# Patient Record
Sex: Female | Born: 1975 | Race: Black or African American | Hispanic: No | Smoking: Never smoker
Health system: Southern US, Community
[De-identification: ages and names within clinical notes are randomized; demographics above are authoritative.]

## PROBLEM LIST (undated history)

## (undated) DIAGNOSIS — I10 Essential (primary) hypertension: Secondary | ICD-10-CM

---

## 2005-09-29 ENCOUNTER — Ambulatory Visit (HOSPITAL_COMMUNITY): Admission: RE | Admit: 2005-09-29 | Discharge: 2005-09-29 | Payer: Self-pay | Admitting: Obstetrics and Gynecology

## 2010-06-12 ENCOUNTER — Emergency Department (HOSPITAL_COMMUNITY): Admission: EM | Admit: 2010-06-12 | Discharge: 2010-06-13 | Payer: Self-pay | Admitting: Emergency Medicine

## 2010-09-05 ENCOUNTER — Encounter: Payer: Self-pay | Admitting: Family Medicine

## 2011-01-26 ENCOUNTER — Other Ambulatory Visit: Payer: Self-pay | Admitting: Obstetrics and Gynecology

## 2011-01-26 ENCOUNTER — Encounter (HOSPITAL_COMMUNITY): Payer: BC Managed Care – PPO

## 2011-01-26 LAB — CBC
HCT: 37.2 % (ref 36.0–46.0)
MCH: 28.9 pg (ref 26.0–34.0)
WBC: 5 10*3/uL (ref 4.0–10.5)

## 2011-01-26 LAB — SURGICAL PCR SCREEN: Staphylococcus aureus: NEGATIVE

## 2011-02-02 ENCOUNTER — Other Ambulatory Visit: Payer: Self-pay | Admitting: Obstetrics and Gynecology

## 2011-02-02 ENCOUNTER — Ambulatory Visit (HOSPITAL_COMMUNITY)
Admission: EM | Admit: 2011-02-02 | Discharge: 2011-02-03 | Disposition: A | Discharge status: Home / Self Care | Payer: BC Managed Care – PPO | Source: Ambulatory Visit | Attending: Obstetrics and Gynecology | Admitting: Obstetrics and Gynecology

## 2011-02-02 ENCOUNTER — Ambulatory Visit (HOSPITAL_COMMUNITY): Admission: AD | Admit: 2011-02-02 | Payer: Self-pay | Source: Ambulatory Visit | Admitting: Obstetrics and Gynecology

## 2011-02-02 DIAGNOSIS — B373 Candidiasis of vulva and vagina: Secondary | ICD-10-CM | POA: Insufficient documentation

## 2011-02-02 DIAGNOSIS — N938 Other specified abnormal uterine and vaginal bleeding: Secondary | ICD-10-CM | POA: Insufficient documentation

## 2011-02-02 DIAGNOSIS — Z01818 Encounter for other preprocedural examination: Secondary | ICD-10-CM | POA: Insufficient documentation

## 2011-02-02 DIAGNOSIS — N949 Unspecified condition associated with female genital organs and menstrual cycle: Secondary | ICD-10-CM | POA: Insufficient documentation

## 2011-02-02 DIAGNOSIS — B3731 Acute candidiasis of vulva and vagina: Secondary | ICD-10-CM | POA: Insufficient documentation

## 2011-02-02 DIAGNOSIS — Z01812 Encounter for preprocedural laboratory examination: Secondary | ICD-10-CM | POA: Insufficient documentation

## 2011-02-03 LAB — CBC
HCT: 36.8 % (ref 36.0–46.0)
MCV: 86.2 fL (ref 78.0–100.0)
Platelets: 345 10*3/uL (ref 150–400)
WBC: 10.9 10*3/uL — ABNORMAL HIGH (ref 4.0–10.5)

## 2011-02-09 ENCOUNTER — Inpatient Hospital Stay (HOSPITAL_COMMUNITY)
Admission: AD | Admit: 2011-02-09 | Discharge: 2011-02-10 | Disposition: A | Discharge status: Home / Self Care | Payer: BC Managed Care – PPO | Source: Ambulatory Visit | Attending: Obstetrics and Gynecology | Admitting: Obstetrics and Gynecology

## 2011-02-09 ENCOUNTER — Inpatient Hospital Stay (HOSPITAL_COMMUNITY): Payer: BC Managed Care – PPO

## 2011-02-09 DIAGNOSIS — R109 Unspecified abdominal pain: Secondary | ICD-10-CM | POA: Insufficient documentation

## 2011-02-09 DIAGNOSIS — G8918 Other acute postprocedural pain: Secondary | ICD-10-CM | POA: Insufficient documentation

## 2011-02-09 LAB — DIFFERENTIAL
Eosinophils Absolute: 0.1 10*3/uL (ref 0.0–0.7)
Eosinophils Relative: 1 % (ref 0–5)
Lymphocytes Relative: 15 % (ref 12–46)
Monocytes Absolute: 1.1 10*3/uL — ABNORMAL HIGH (ref 0.1–1.0)
Monocytes Relative: 10 % (ref 3–12)

## 2011-02-09 LAB — COMPREHENSIVE METABOLIC PANEL
ALT: 7 U/L (ref 0–35)
AST: 14 U/L (ref 0–37)
Albumin: 3.1 g/dL — ABNORMAL LOW (ref 3.5–5.2)
CO2: 26 mEq/L (ref 19–32)
Calcium: 9 mg/dL (ref 8.4–10.5)
Creatinine, Ser: 0.71 mg/dL (ref 0.50–1.10)
GFR calc Af Amer: >60 mL/min (ref >60)
Glucose, Bld: 96 mg/dL (ref 70–99)
Total Bilirubin: 0.3 mg/dL (ref 0.3–1.2)
Total Protein: 6.9 g/dL (ref 6.0–8.3)

## 2011-02-09 LAB — CBC: RBC: 4.06 MIL/uL (ref 3.87–5.11)

## 2011-02-10 ENCOUNTER — Encounter (HOSPITAL_COMMUNITY): Payer: Self-pay

## 2011-02-10 LAB — URINALYSIS, ROUTINE W REFLEX MICROSCOPIC
Glucose, UA: NEGATIVE mg/dL
Nitrite: NEGATIVE
Protein, ur: NEGATIVE mg/dL

## 2011-02-10 MED ORDER — IOHEXOL 300 MG/ML  SOLN
100.0000 mL | Freq: Once | INTRAMUSCULAR | Status: AC | PRN
  Administered 2011-02-10: 100 mL via INTRAVENOUS

## 2011-02-11 LAB — URINE CULTURE
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
## 2011-03-04 NOTE — Op Note (Signed)
[name], [name]               ACCOUNT NO.:  [id number]  MEDICAL RECORD NO.:  [id number]  LOCATION:  9316                          FACILITY:  [facility]  PHYSICIAN:  Osborn Coho, M.D.   DATE OF BIRTH:  1975-10-24  DATE OF PROCEDURE:  02/02/2011 DATE OF DISCHARGE:                              OPERATIVE REPORT   PREOPERATIVE DIAGNOSES: 1. Dysfunctional uterine bleeding. 2. Menorrhagia. 3. Status post ablation.  POSTOPERATIVE DIAGNOSES: 1. Dysfunctional uterine bleeding. 2. Menorrhagia. 3. Status post ablation.  PROCEDURES: 1. Total laparoscopic hysterectomy. 2. Cystoscopy.  ATTENDING SURGEON:  Osborn Coho, MD  ASSISTANT:  Naima A. Dillard, MD  ANESTHESIA:  General.  SPECIMENS TO PATHOLOGY:  Uterus and cervix weighing 131 grams.  FLUIDS:  1000 mL.  URINE OUTPUT:  300 mL.  ESTIMATED BLOOD LOSS:  100 mL.  COMPLICATIONS:  None.  DESCRIPTION OF PROCEDURE:  The patient was taken to the operating room after risks, benefits, and alternatives discussed with the patient.  The patient verbalized understanding, consent signed and witness.  The patient was placed under general anesthesia and prepped and draped in the normal sterile fashion in the dorsal lithotomy position.  A weighted speculum placed in the patient's vagina and vaginal wall retractors placed to visualize the cervix.  The cervix was grasped with single- tooth tenaculum and sounded to approximately 7 cm.  A size 6 probe for the Rumi was used and placed into the uterus without difficulty.  The intrauterine balloon was unable to be insufflated secondary to the history of the ablation and so the [facility] ring was sutured to the cervix. The occluder was then insufflated with about approximately 60 mL of saline.  The Foley was placed in the bladder to gravity.  After re- gloving and gowning, attention was then turned to the abdomen where a 10- mm umbilical incision was made in a vertical fashion to the level of  the patient's prior umbilical hernia repair scar.  Open laparoscopy was performed and the incision was taken down to the layer of the fascia which was then incised and the peritoneum entered.  The Hasson was placed after placing a pursestring stitch of 0 Vicryl in the fascia. The camera was placed as well and normal-appearing bilateral ovaries and fallopian tubes were old noted with what appeared to be a normal- appearing corpus luteal cyst on the patient's right ovary.  Dilute Marcaine was injected at all incision sites and attention was then turned to the right lower quadrant where a 5-mm incision was made and 5- mm trocar advanced under direct visualization.  Two fingerbreadths rostral, and medial to this incision, a 10-mm incision was made and 10- mm trocar advanced to the intra-abdominal cavity under direct visualization.  In the left lower quadrant, a 5-mm incision was made and 5-mm trocar advanced under direct visualization.  Using the Harmonic, the right utero-ovarian, fallopian tube, and round ligament was excised and bladder flap created on that side.  The same was done on the contralateral side.  Anterior colpotomy was performed at the level of the Koh ring and the right uterine vessel was cauterized and excised, and the Koh ring circumscribed to around to the right side of  the uterus.  The same was done on the contralateral side.  The remainder of the [facility] ring was then circumscribed with the harmonic and the uterus was removed and placed in the vagina.  The vaginal cuff was repaired with 1 PDS via several stitches that were sutured laparoscopically.  The bilateral ovarian pedicles were noted to be hemostatic and the intra- abdominal cavity was copiously irrigated.  The cuff was hemostatic as well.  The fascial closure device was used at the right lower quadrant 10-mm port using 0 Vicryl and the incision was repaired with 3-0 Monocryl via subcuticular stitch.  All the trocars  were removed under direct visualization.  The pursestring stitch was tied at the umbilicus and the umbilical incision was repaired with 3-0 Monocryl via subcuticular stitch.  All incisions were prepped with Dermabond. Attention was then turned to the perineum where the Foley was removed and indigo carmine administered.  A bivalve speculum was placed in the patient's vagina and the vaginal cuff had good reapproximation. Cystoscopy was then performed and bilateral ureters were noted to be efflux without difficulty.  There were also no inadvertent bladder injuries.  All instruments were removed and the Foley was again placed in the bladder to gravity.  The patient tolerated the procedure well and was awaiting extubation and returned to recovery room in good condition.     Osborn Coho, M.D.     AR/MEDQ  D:  02/02/2011  T:  02/03/2011  Job:  [id number]  Electronically Signed by Osborn Coho M.D. on 03/04/2011 05:32:37 PM

## 2011-09-13 ENCOUNTER — Other Ambulatory Visit: Payer: Self-pay | Admitting: Family Medicine

## 2011-09-13 ENCOUNTER — Ambulatory Visit
Admission: RE | Admit: 2011-09-13 | Discharge: 2011-09-13 | Disposition: A | Discharge status: Home / Self Care | Payer: BC Managed Care – PPO | Source: Ambulatory Visit | Attending: Family Medicine | Admitting: Family Medicine

## 2011-09-13 DIAGNOSIS — R1909 Other intra-abdominal and pelvic swelling, mass and lump: Secondary | ICD-10-CM

## 2011-09-28 ENCOUNTER — Emergency Department (HOSPITAL_COMMUNITY): Payer: BC Managed Care – PPO

## 2011-09-28 ENCOUNTER — Encounter (HOSPITAL_COMMUNITY): Payer: Self-pay | Admitting: *Deleted

## 2011-09-28 ENCOUNTER — Emergency Department (HOSPITAL_COMMUNITY)
Admission: EM | Admit: 2011-09-28 | Discharge: 2011-09-28 | Disposition: A | Discharge status: Home / Self Care | Payer: BC Managed Care – PPO | Attending: Emergency Medicine | Admitting: Emergency Medicine

## 2011-09-28 ENCOUNTER — Other Ambulatory Visit: Payer: Self-pay

## 2011-09-28 DIAGNOSIS — R42 Dizziness and giddiness: Secondary | ICD-10-CM | POA: Insufficient documentation

## 2011-09-28 DIAGNOSIS — R5381 Other malaise: Secondary | ICD-10-CM | POA: Insufficient documentation

## 2011-09-28 DIAGNOSIS — Z79899 Other long term (current) drug therapy: Secondary | ICD-10-CM | POA: Insufficient documentation

## 2011-09-28 DIAGNOSIS — R030 Elevated blood-pressure reading, without diagnosis of hypertension: Secondary | ICD-10-CM | POA: Insufficient documentation

## 2011-09-28 DIAGNOSIS — I10 Essential (primary) hypertension: Secondary | ICD-10-CM

## 2011-09-28 DIAGNOSIS — R002 Palpitations: Secondary | ICD-10-CM

## 2011-09-28 LAB — URINALYSIS, ROUTINE W REFLEX MICROSCOPIC
Bilirubin Urine: NEGATIVE
Glucose, UA: NEGATIVE mg/dL
Hgb urine dipstick: NEGATIVE
Ketones, ur: NEGATIVE mg/dL
Protein, ur: NEGATIVE mg/dL

## 2011-09-28 LAB — BASIC METABOLIC PANEL
BUN: 9 mg/dL (ref 6–23)
CO2: 24 mEq/L (ref 19–32)
Calcium: 8.8 mg/dL (ref 8.4–10.5)
Creatinine, Ser: 0.71 mg/dL (ref 0.50–1.10)
Glucose, Bld: 80 mg/dL (ref 70–99)

## 2011-09-28 LAB — DIFFERENTIAL
Eosinophils Relative: 2 % (ref 0–5)
Lymphocytes Relative: 60 % — ABNORMAL HIGH (ref 12–46)
Lymphs Abs: 2.8 10*3/uL (ref 0.7–4.0)
Monocytes Absolute: 0.4 10*3/uL (ref 0.1–1.0)

## 2011-09-28 LAB — CBC
HCT: 38.3 % (ref 36.0–46.0)
MCH: 28.2 pg (ref 26.0–34.0)
MCV: 83.6 fL (ref 78.0–100.0)
RBC: 4.58 MIL/uL (ref 3.87–5.11)
WBC: 4.7 10*3/uL (ref 4.0–10.5)

## 2011-09-28 LAB — PREGNANCY, URINE: Preg Test, Ur: NEGATIVE

## 2011-09-28 LAB — TROPONIN I: Troponin I: <0.3 ng/mL (ref <0.30)

## 2011-09-28 MED ORDER — SODIUM CHLORIDE 0.9 % IV BOLUS (SEPSIS)
1000.0000 mL | Freq: Once | INTRAVENOUS | Status: AC
  Administered 2011-09-28: 1000 mL via INTRAVENOUS

## 2011-09-28 NOTE — Discharge Instructions (Signed)
Please read over the instructions below. Your EKG, cardiac enzymes, urine test and remaining labwork tonight were all normal. Your exam was normal as well. The exact cause of your symptoms tonight remains unclear. Your blood pressure was persistently elevated tonight with a high low range of 156/111 to 146/97. This will need to be re-evaluated by your primary care physician to determine if this is a trend for you that will require treatment for hypertension. Please cal Dr Clarene Duke today to arrange a follow up visit for further evaluation of these symptoms and your blood pressure. Return if symptoms worsen.  Dizziness  Dizziness is a common problem. It is a feeling of unsteadiness or lightheadedness. You may feel like you are about to faint. Dizziness can lead to injury if you stumble or fall. A person of any age group can suffer from dizziness, but dizziness is more common in older adults. CAUSES  Dizziness can be caused by many different things, including:  Middle ear problems.   Standing for too long.   Infections.   An allergic reaction.   Aging.   An emotional response to something, such as the sight of blood.   Side effects of medicines.   Fatigue.   Problems with circulation or blood pressure.   Excess use of alcohol, medicines, or illegal drug use.   Breathing too fast (hyperventilation).   An arrhythmia or problems with your heart rhythm.   Anemia or a low blood count.   Pregnancy.   Vomiting, diarrhea, fever, or other illnesses that cause dehydration.   Diseases or conditions such as Parkinson's disease, high blood pressure (hypertension), diabetes, and thyroid problems.   Exposure to extreme heat.  DIAGNOSIS  To find the cause of your dizziness, your caregiver may do a physical exam, lab tests, radiologic imaging scans, or an electrocardiography test (ECG).  TREATMENT  Treatment of dizziness depends on the cause of your symptoms and can vary greatly. HOME CARE  INSTRUCTIONS   Drink enough fluids to keep your urine clear or pale yellow. This is especially important in very hot weather. In the elderly, it is also important in cold weather.   If your dizziness is caused by medicines, take them exactly as directed. When taking blood pressure medicines, it is especially important to get up slowly.   Rise slowly from chairs and steady yourself until you feel okay.   In the morning, first sit up on the side of the bed. When this seems okay, stand slowly while holding onto something until you know your balance is fine.   If you need to stand in one place for a long time, be sure to move your legs often. Tighten and relax the muscles in your legs while standing.   If dizziness continues to be a problem, have someone stay with you for a day or two. Do this until you feel you are well enough to stay alone. Have the person call your caregiver if he or she notices changes in you that are concerning.   Do not drive or use heavy machinery if you feel dizzy.  SEEK IMMEDIATE MEDICAL CARE IF:   Your dizziness or lightheadedness gets worse.   You feel nauseous or vomit.   You develop problems with talking, walking, weakness, or using your arms, hands, or legs.   You are not thinking clearly or you have difficulty forming sentences. It may take a friend or family member to determine if your thinking is normal.   You develop chest  pain, abdominal pain, shortness of breath, or sweating.   Your vision changes.   You notice any bleeding.   You have side effects from medicine that seems to be getting worse rather than better.  MAKE SURE YOU:   Understand these instructions.   Will watch your condition.   Will get help right away if you are not doing well or get worse.  Document Released: 01/25/2001 Document Revised: 04/05/2011 Document Reviewed: 02/18/2011 [facility] Patient Information 2012 [place], [place].  Near-Syncope  Near-syncope is sudden weakness,  dizziness, or feeling like you might pass out (faint). This can happen when getting up or while standing for a long time. It can be caused by a drop in blood pressure. It is common in people taking medicine for blood pressure. Fainting can happen when the blood pressure or pulse is too low. HOME CARE  If you feel like you are going to pass out:   Lie down right away.   Breathe deeply and steadily.   Move only when the feeling has gone away. Most of the time, this feeling lasts only a few minutes. You may feel tired for several hours.   Drink enough fluids to keep your pee (urine) clear or pale yellow.   If you are taking blood pressure or heart medicine, stand up slowly.  GET HELP RIGHT AWAY IF:   You have a severe headache.   Unusual pain develops in the chest, belly (abdomen), or back.   You have bleeding from the mouth or butt (rectum), or you have black or tarry poop (stool).   You feel your heart beat differently than normal, or you have a very fast pulse.   You pass out, or you twitch and shake when you pass out.   You pass out when sitting or lying down.   You feel confused.   You have trouble walking.   You are weak.   You have vision problems.  MAKE SURE YOU:   Understand these instructions.   Will watch your condition.   Will get help right away if you are not doing well or get worse.  Document Released: 01/18/2008 Document Revised: 04/13/2011 Document Reviewed: 09/17/2010 [facility] Patient Information 2012 [place], [place].  Palpitations   A palpitation is the feeling that your heartbeat is irregular or is faster than normal. Although this is frightening, it usually is not serious. Palpitations may be caused by excesses of smoking, caffeine, or alcohol. They are also brought on by stress and anxiety. Sometimes, they are caused by heart disease. Unless otherwise noted, your caregiver did not find any signs of serious illness at this time. HOME CARE INSTRUCTIONS   To help prevent palpitations:  Drink decaffeinated coffee, tea, and soda pop. Avoid chocolate.   If you smoke or drink alcohol, quit or cut down as much as possible.   Reduce your stress or anxiety level. Biofeedback, yoga, or meditation will help you relax. Physical activity such as swimming, jogging, or walking also may be helpful.  SEEK MEDICAL CARE IF:   You continue to have a fast heartbeat.   Your palpitations occur more often.  SEEK IMMEDIATE MEDICAL CARE IF: You develop chest pain, shortness of breath, severe headache, dizziness, or fainting. Document Released: 07/29/2000 Document Revised: 04/13/2011 Document Reviewed: 09/28/2007 [facility] Patient Information 2012 [place], [place].Vertigo Vertigo means you feel like you are moving when you are not. Vertigo can make you feel like things around you are moving when they are not. This problem often goes away  on its own.  HOME CARE   Follow your doctor's instructions.   Avoid driving.   Avoid using heavy machinery.   Avoid doing any activity that could be dangerous if you have a vertigo attack.   Tell your doctor if a medicine seems to cause your vertigo.  GET HELP RIGHT AWAY IF:   Your medicines do not help or make you feel worse.   You have trouble talking or walking.   You feel weak or have trouble using your arms, hands, or legs.   You have bad headaches.   You keep feeling sick to your stomach (nauseous) or throwing up (vomiting).   Your vision changes.   A family member notices changes in your behavior.   Your problems get worse.  MAKE SURE YOU:  Understand these instructions.   Will watch your condition.   Will get help right away if you are not doing well or get worse.  Document Released: 05/10/2008 Document Revised: 04/13/2011 Document Reviewed: 02/17/2011 [facility] Patient Information 2012 [place], [place].

## 2011-09-28 NOTE — ED Notes (Signed)
Pt in stating she woke up feeling dizzy, when she stood up the dizziness increased and she felt like she was going to pass out and that her heart was racing, pt states she just doesn't feel right

## 2011-09-28 NOTE — ED Provider Notes (Signed)
History     CSN: [id number]  Arrival date & time 09/28/11  4098   First MD Initiated Contact with Patient 09/28/11 0424      Chief Complaint  Patient presents with  . Dizziness     Patient is a 36 y.o. female presenting with weakness. The history is provided by the patient.  Weakness The primary symptoms include dizziness. The symptoms are improving. The neurological symptoms are diffuse.  Dizziness also occurs with weakness.  Additional symptoms include weakness.  Pt states she awoke tonighjt prior to arrival and stated when she opened her eyes she felt as if the room was spinning. Sx's were similar to one's she has had associated w/ vertigo (for which she has been treated for in past) but worse. The room spinning lasted only 1-2 minutes. She stood briefly but felt "lightheaded" and sat back down. When she stood again she felt her heart racing and briefly felt as if she would faint but did not. Symptoms seemed to improve but did not resolve. She then showered and when the lightheadedness persisted she came to ED. Pt denies that at any time during this episode did she have CP, SOB, diaphoresis, n/v, visual disturbances, paresesias  or other unilateral symptoms.  Symptoms have nearly resolved but pt states she continues to feel "spacey".  History reviewed. No pertinent past medical history.  History reviewed. No pertinent past surgical history.  History reviewed. No pertinent family history.  History  Substance Use Topics  . Smoking status: Not on file  . Smokeless tobacco: Not on file  . Alcohol Use: Not on file    OB History    Grav Para Term Preterm Abortions TAB SAB Ect Mult Living                  Review of Systems  HENT: Negative.   Eyes: Negative.   Respiratory: Negative.   Cardiovascular: Positive for palpitations.  Gastrointestinal: Negative.   Genitourinary: Negative.   Musculoskeletal: Negative.   Skin: Negative.   Neurological: Positive for dizziness and  weakness.  Hematological: Negative.   Psychiatric/Behavioral: Negative.     Allergies  Diflucan and Flagyl  Home Medications   Current Outpatient Rx  Name Route Sig Dispense Refill  . FLUOXETINE HCL 10 MG PO CAPS Oral Take 10 mg by mouth daily.    Marland Kitchen VITAMIN D (ERGOCALCIFEROL) 50000 UNITS PO CAPS Oral Take 50,000 Units by mouth every 7 (seven) days.      BP 142/105  Pulse 70  Temp(Src) 98 F (36.7 C) (Oral)  Resp 20  SpO2 100%  Physical Exam  Constitutional: She is oriented to person, place, and time. She appears well-developed and well-nourished.  HENT:  Head: Normocephalic and atraumatic.  Right Ear: Tympanic membrane, external ear and ear canal normal.  Left Ear: Tympanic membrane, external ear and ear canal normal.  Nose: Nose normal.  Mouth/Throat: Uvula is midline, oropharynx is clear and moist and mucous membranes are normal.  Eyes: Conjunctivae and EOM are normal. Pupils are equal, round, and reactive to light.  Neck: Neck supple.  Cardiovascular: Normal rate and regular rhythm.        Hypertensive  Pulmonary/Chest: Effort normal and breath sounds normal.  Abdominal: Soft. Bowel sounds are normal.  Musculoskeletal: Normal range of motion.  Neurological: She is alert and oriented to person, place, and time. She has normal strength and normal reflexes. No cranial nerve deficit or sensory deficit. She displays a negative Romberg sign. Coordination normal. GCS  eye subscore is 4. GCS verbal subscore is 5. GCS motor subscore is 6.  Skin: Skin is warm and dry.  Psychiatric: She has a normal mood and affect.    ED Course  Procedures   Date: 09/28/2011  Rate: 65  Rhythm: Sinus rhythm  QRS Axis: normal  Intervals: normal  ST/T Wave abnormalities: normal  Conduction Disutrbances: Incomplete right bundle branch block  Narrative Interpretation:   Old EKG Reviewed: none available Findings and clinical impression discussed w/ pt. Will plan for d/c home w/ recommendation  to arrange close f/u w/ PCP for further evaluation of symptoms and HTN. Pt is agreeable w/ plan. Discussed pt w/ Dr Manus Gunning who has also seen and examined pt and is in agreement w/ plan.  Labs Reviewed  DIFFERENTIAL - Abnormal; Notable for the following:    Neutrophils Relative 29 (*)    Neutro Abs 1.4 (*)    Lymphocytes Relative 60 (*)    All other components within normal limits  BASIC METABOLIC PANEL - Abnormal; Notable for the following:    Sodium 133 (*)    All other components within normal limits  CBC  URINALYSIS, ROUTINE W REFLEX MICROSCOPIC  PREGNANCY, URINE  TROPONIN I   No results found.   No diagnosis found.    MDM  HPI/PE and clinical findings c/w 1.Dizziness (No focal neurological findings, EKG normal, Trop I neg, CXR normal) 2. Palpitations (EKG normal) Acute cardiac event considered but unlikely 3. HTN (Persistent, new onset and possible source of symptoms)    Leanne Chang, NP 09/30/11 [phone]

## 2011-09-30 NOTE — ED Provider Notes (Signed)
Medical screening examination/treatment/procedure(s) were conducted as a shared visit with non-physician practitioner(s) and myself.  I personally evaluated the patient during the encounter  Lightheadedness and palpitations.  No chest pain, SOB, nausea, vomiting.  EKG nonischemic, no Brugada.  Nonfocal neuro exam, no nystagmus.  Some catch up saccades on head impulse testing consistent with vestibular nerve dysfunction.  Glynn Octave, MD 09/30/11 [phone]

## 2013-10-02 IMAGING — CT CT PELVIS W/O CM
2 of 3 series · 14 of 36 positions shown, 19 images · IV contrast (OMNI 300, WATER)
Comparison: 02/10/2011.

CLINICAL DATA: Left inguinal bulge.  Inguinal hernia versus
adenopathy.  Inguinal mass.

CT PELVIS WITHOUT CONTRAST
TECHNIQUE: Multidetector CT imaging of the pelvis was performed
following the standard protocol without intravenous contrast.

[Series 601: coronal body · coronal · 0.78mm/px · 1 of 112 slices shown, 2 images]
[im 38/112  soft-tissue]
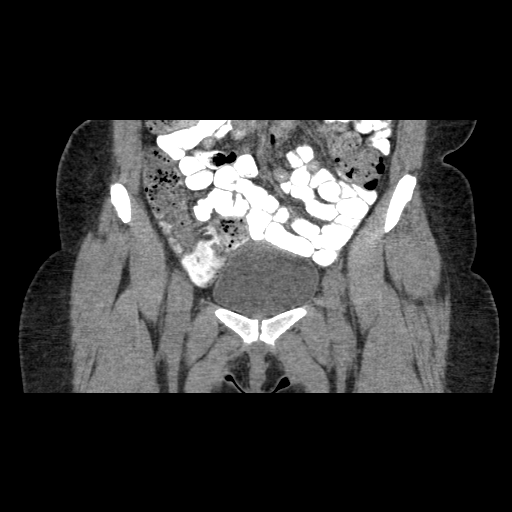
[im 38/112  bone]
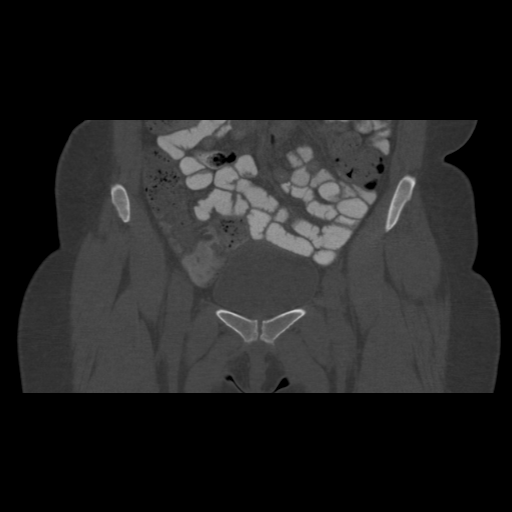

[Series 602: sagittal body · sagittal · 0.78mm/px · 13 of 161 slices shown, 17 images]
[im 7/161  lung]
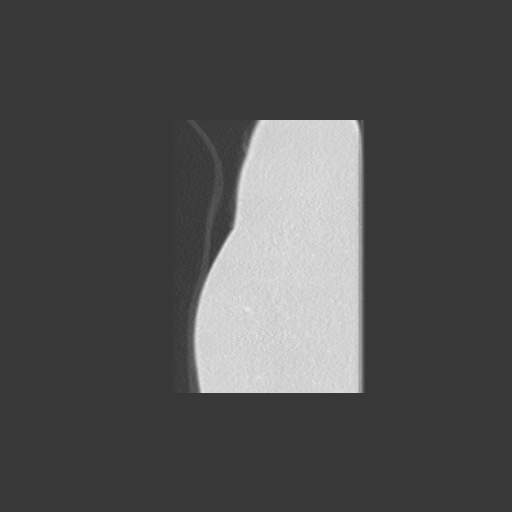
[im 13/161  soft-tissue]
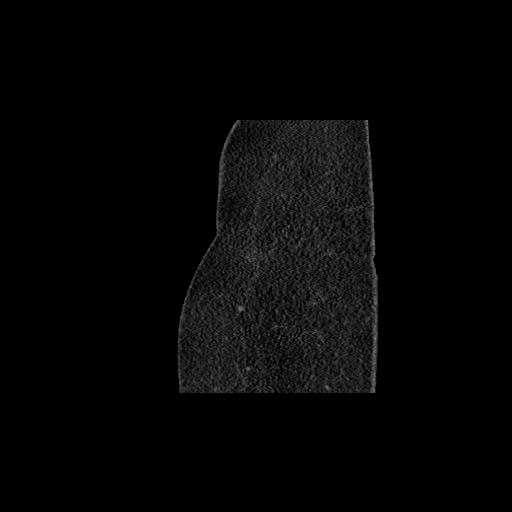
[im 13/161  lung]
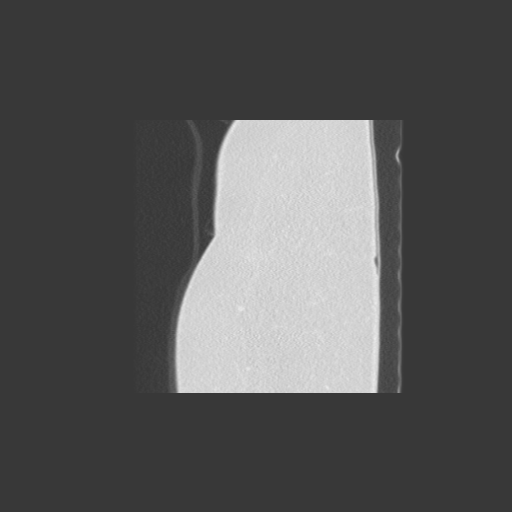
[im 13/161  bone]
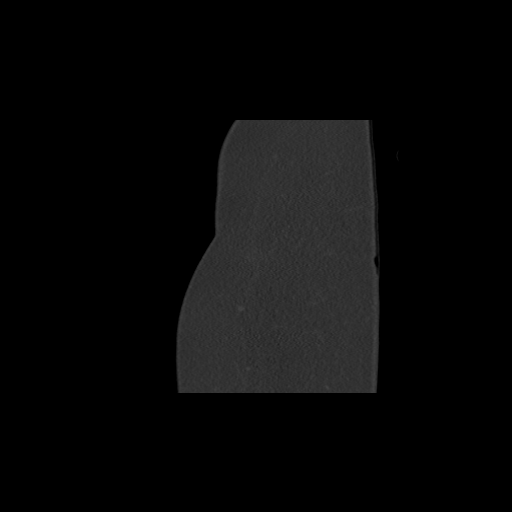
[im 19/161  lung]
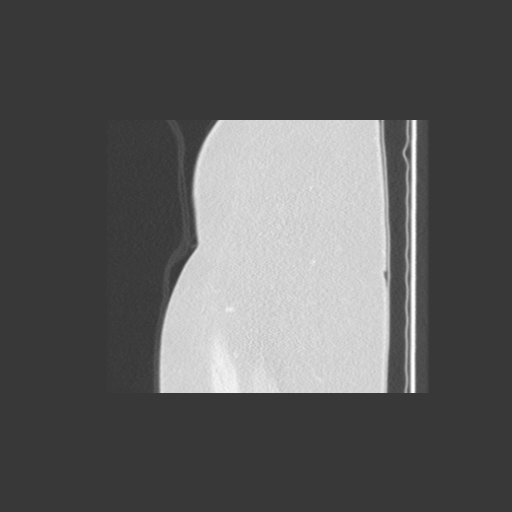
[im 25/161  soft-tissue]
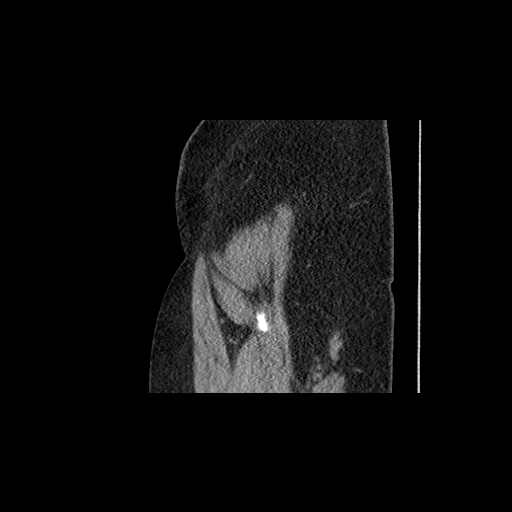
[im 25/161  lung]
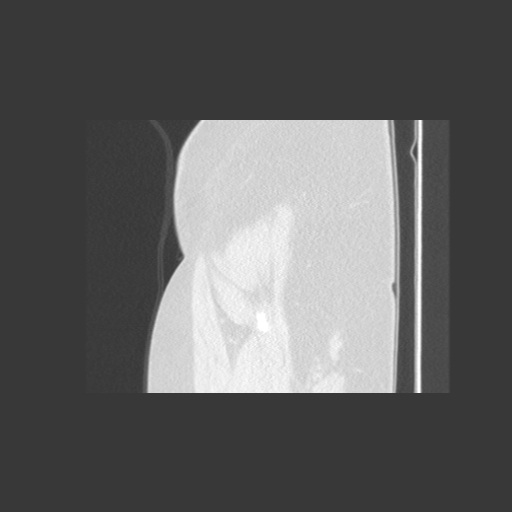
[im 37/161  soft-tissue]
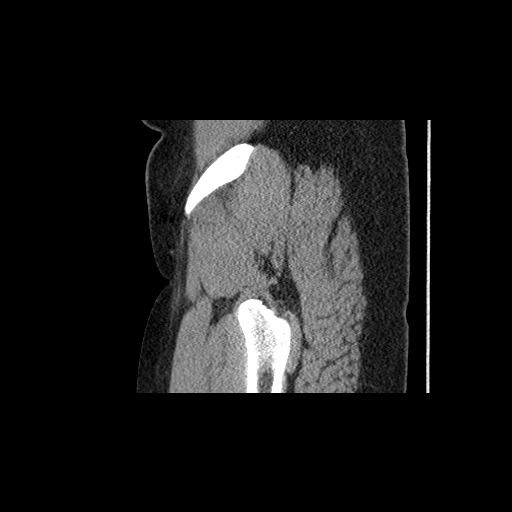
[im 50/161  soft-tissue]
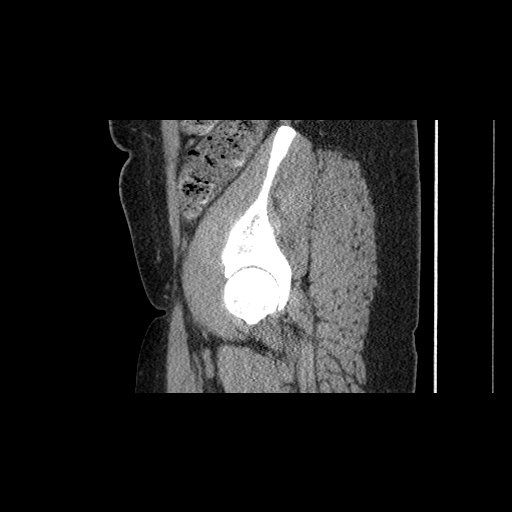
[im 68/161  soft-tissue]
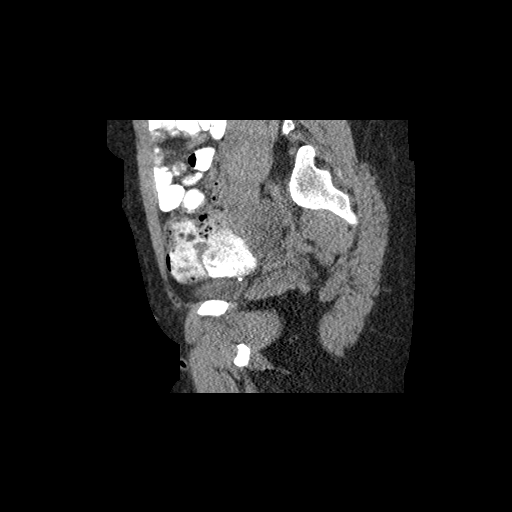
[im 81/161  soft-tissue]
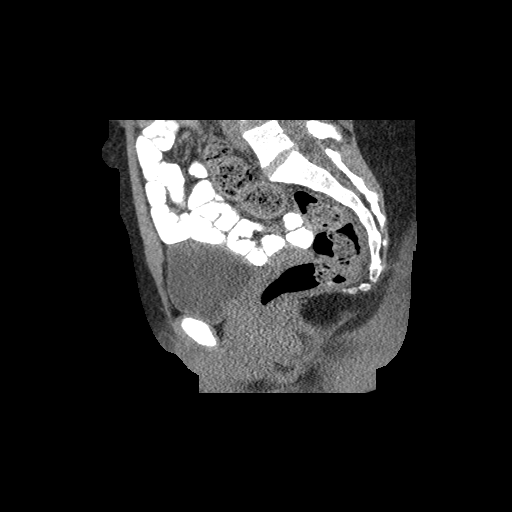
[im 93/161  soft-tissue]
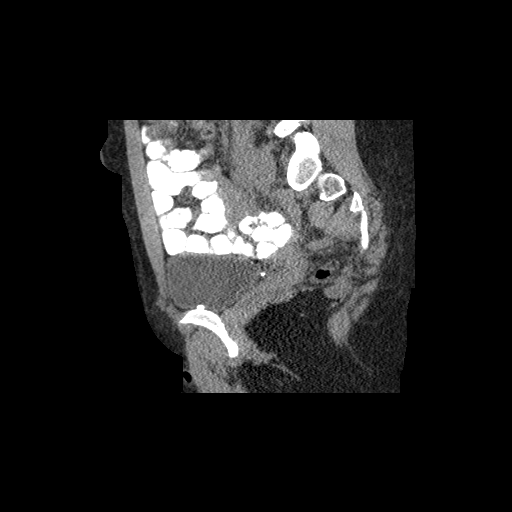
[im 111/161  soft-tissue]
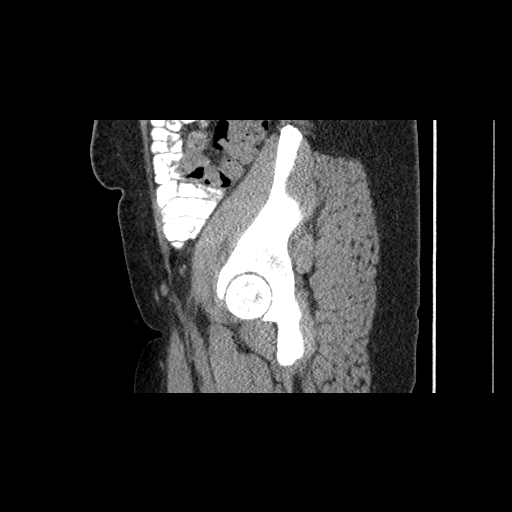
[im 124/161  soft-tissue]
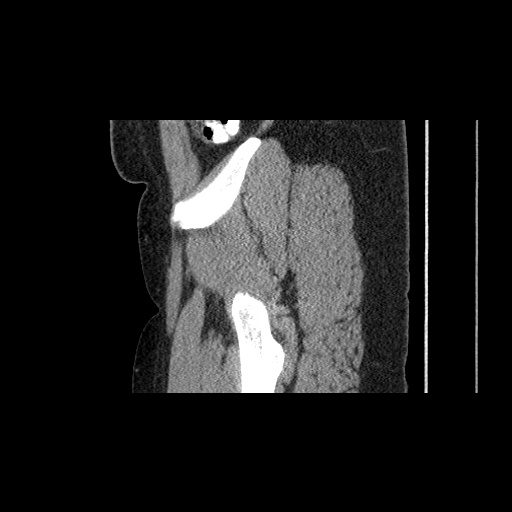
[im 124/161  bone]
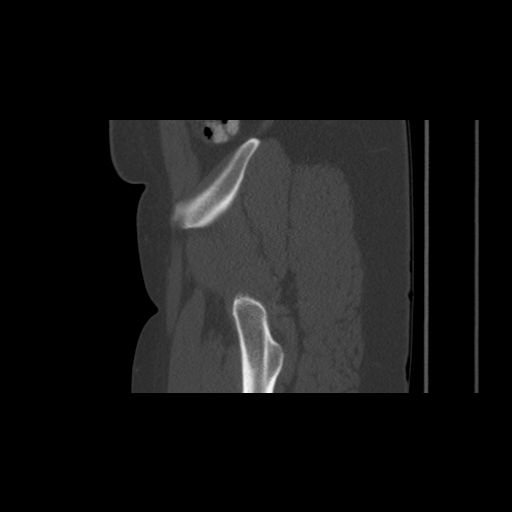
[im 136/161  soft-tissue]
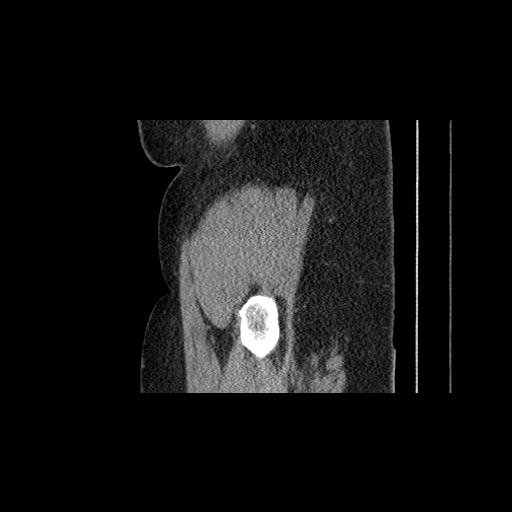
[im 148/161  soft-tissue]
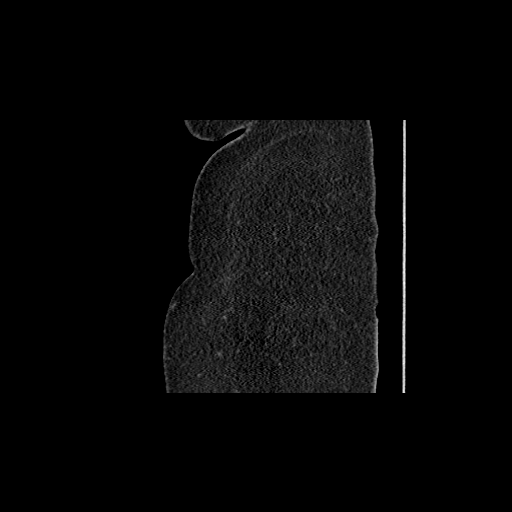

[14 of 36 positions shown; findings below may reference images not displayed]

FINDINGS: 22 mm x 21 mm nodule is present in the left inguinal
region, with mild adjacent fat stranding.  This probably represents
an inflamed inguinal lymph node.  The appearance is nonspecific.
Neoplasm cannot be excluded.  Right inguinal nodes appear normal.
The note of [place] is not enlarged on the left.  Suboptimal
evaluation of external iliac and common iliac nodes secondary to
lack of IV contrast.  The pelvic small and large bowel appear
normal.  Normal appendix identified.  The pelvic bones appear
within normal limits.  Bilateral SI joint degenerative disease is
present with vacuum joint.
IMPRESSION: 22 mm x 21 mm nodule in the left inguinal canal is most compatible
with inguinal lymphadenopathy.  This may be reactive or neoplastic.
The appearance is nonspecific.  No other adenopathy is identified.

## 2015-03-20 ENCOUNTER — Other Ambulatory Visit: Payer: Self-pay | Admitting: Family Medicine

## 2015-03-20 DIAGNOSIS — M542 Cervicalgia: Secondary | ICD-10-CM

## 2015-03-25 ENCOUNTER — Ambulatory Visit
Admission: RE | Admit: 2015-03-25 | Discharge: 2015-03-25 | Disposition: A | Discharge status: Home / Self Care | Payer: BLUE CROSS/BLUE SHIELD | Source: Ambulatory Visit | Attending: Family Medicine | Admitting: Family Medicine

## 2015-03-25 DIAGNOSIS — M542 Cervicalgia: Secondary | ICD-10-CM

## 2017-04-13 IMAGING — US US SOFT TISSUE HEAD/NECK
1 series · 14 of 25 positions shown · non-contrast
Comparison: None.

CLINICAL DATA: Neck pain for 2 weeks

EXAM:
THYROID ULTRASOUND
TECHNIQUE: Ultrasound examination of the thyroid gland and adjacent soft
tissues was performed.

[Series 1: us soft tissue head/neck · 0.09mm/px · 14 of 58 slices shown]
[im 1/58]
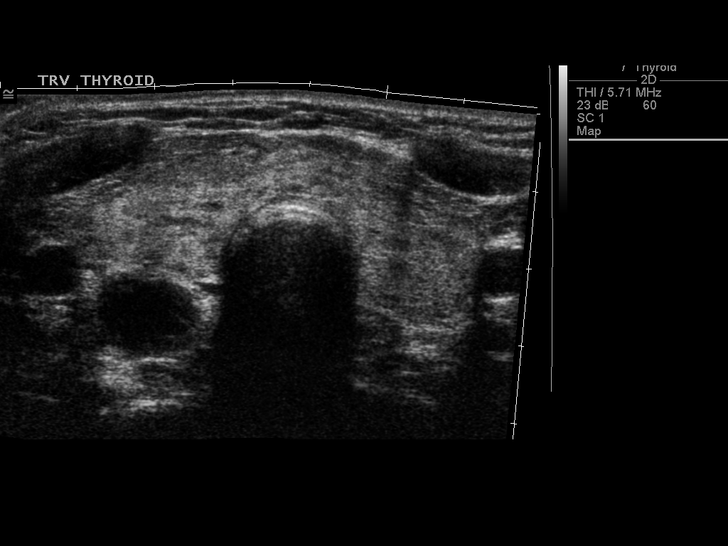
[im 5/58]
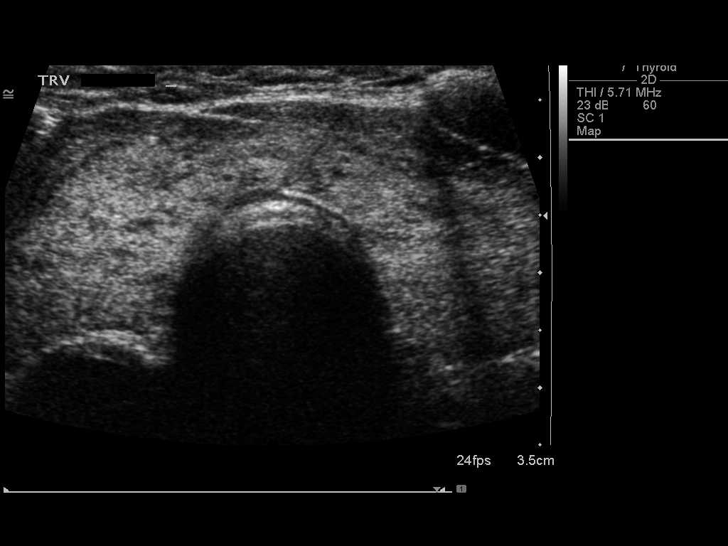
[im 10/58]
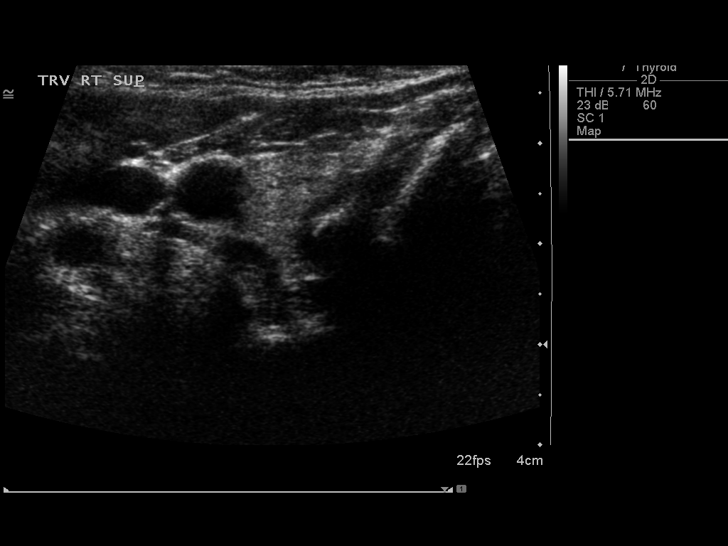
[im 15/58]
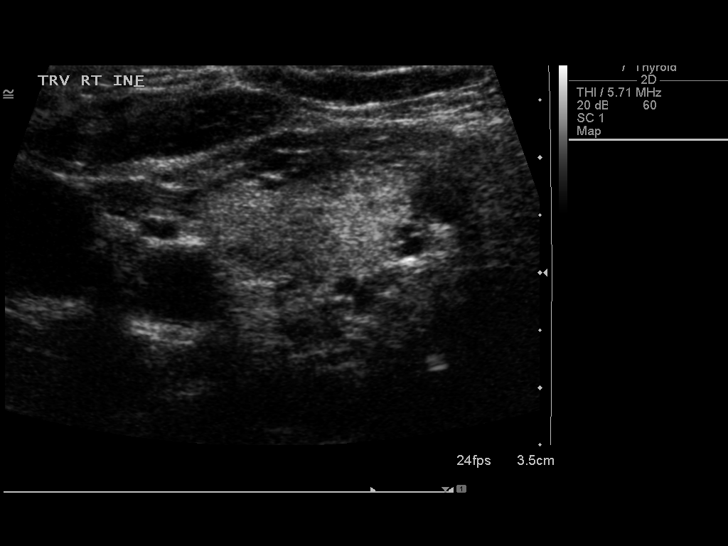
[im 20/58]
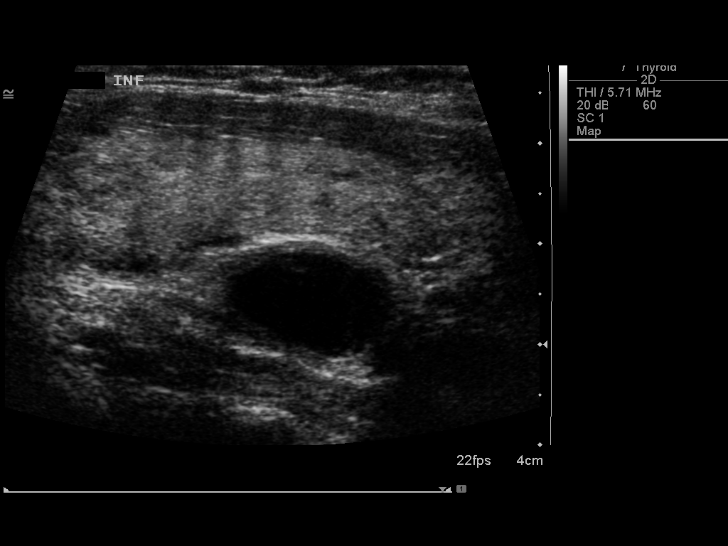
[im 22/58]
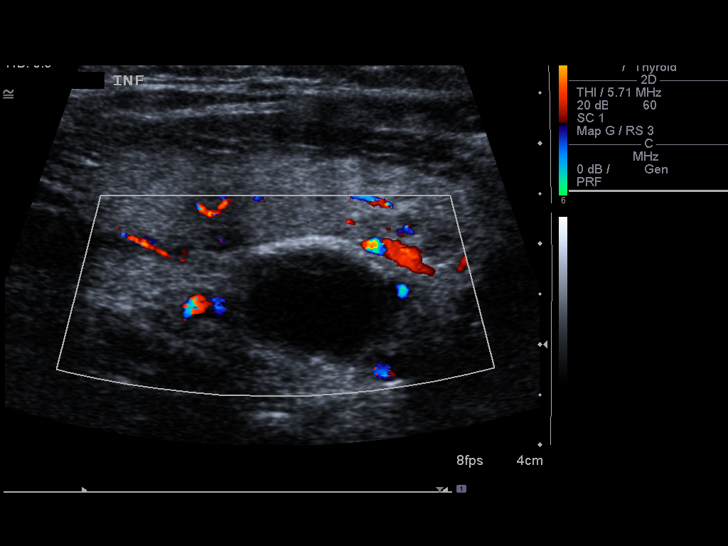
[im 27/58]
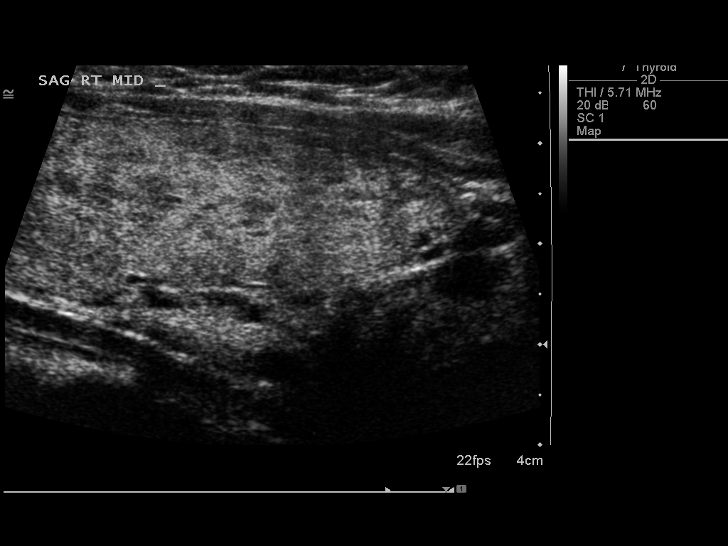
[im 31/58]
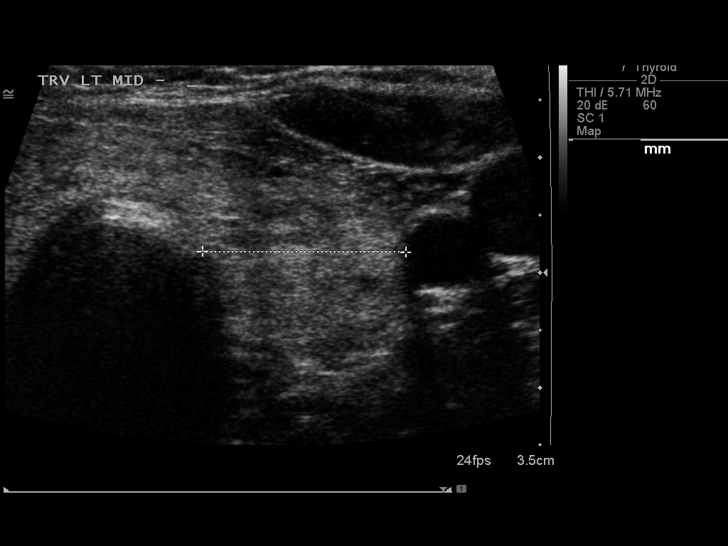
[im 36/58]
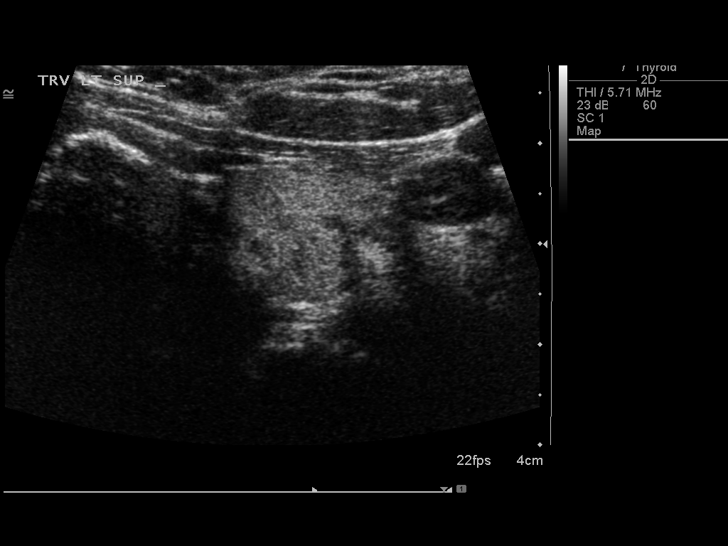
[im 39/58]
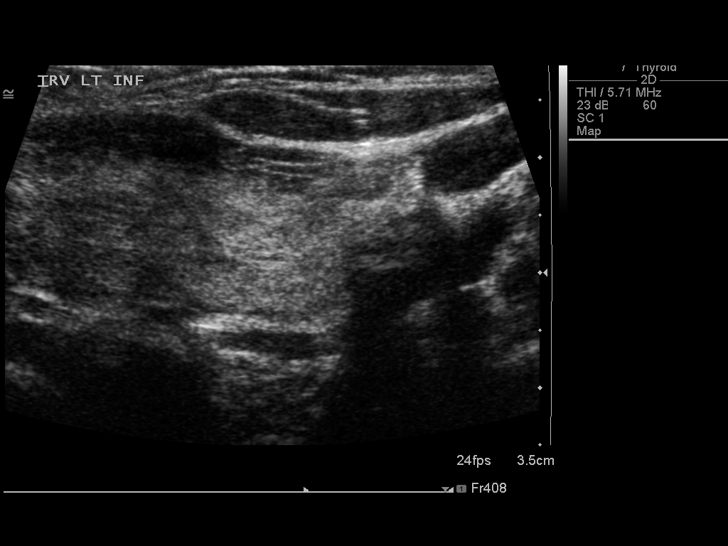
[im 43/58]
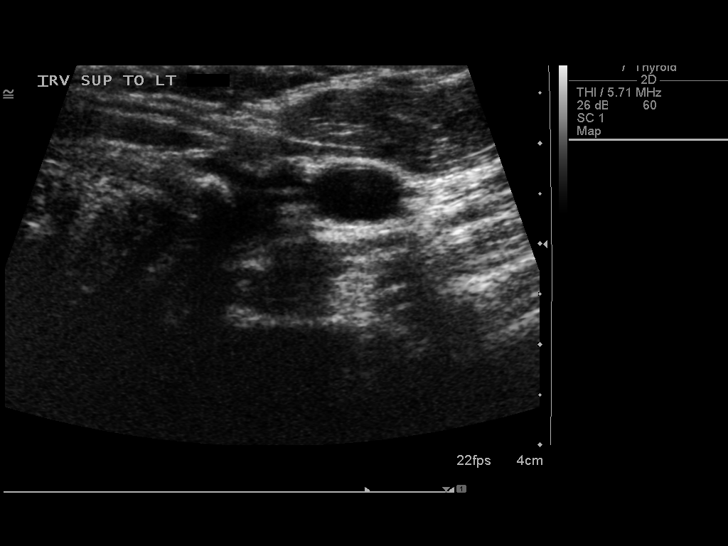
[im 48/58]
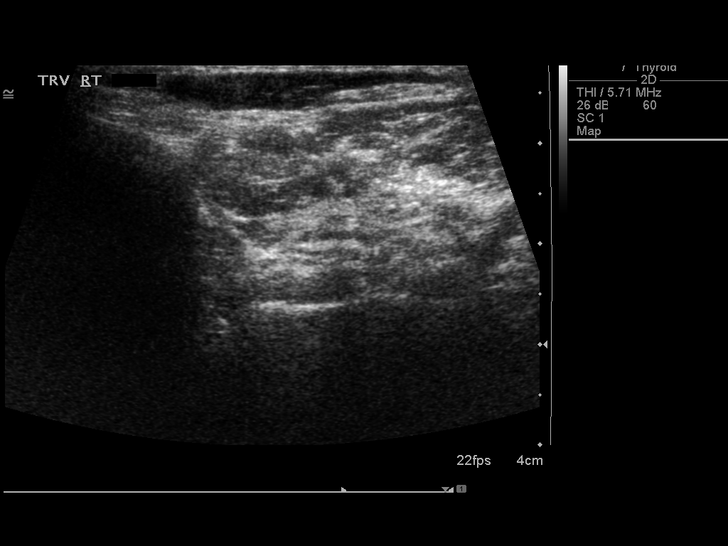
[im 53/58]
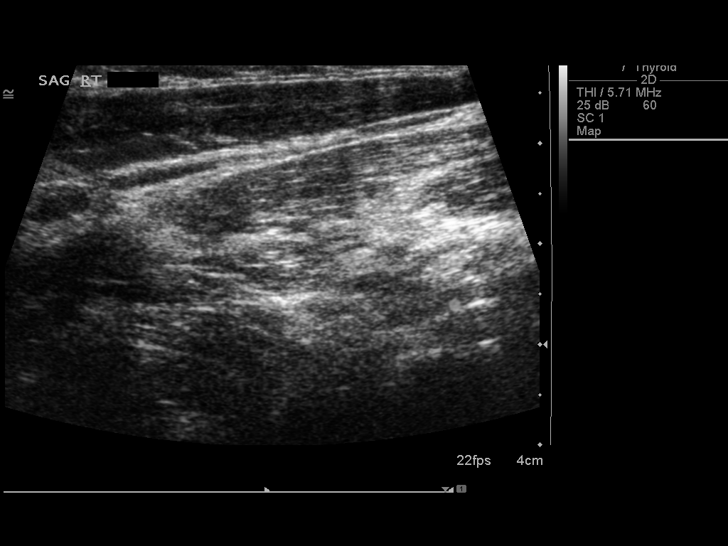
[im 58/58]
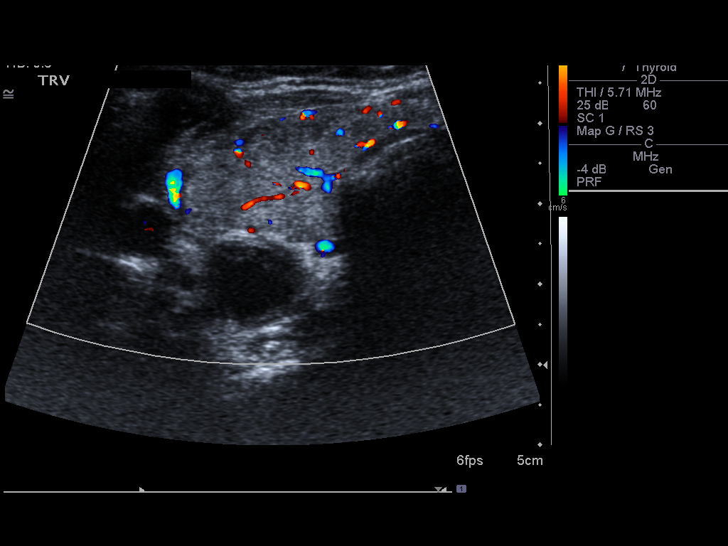

[14 of 25 positions shown; findings below may reference images not displayed]

FINDINGS: Right thyroid lobe

Measurements: 7.1 x 1.5 x 2.4 cm. 2.2 x 1.3 x 1.7 cm hypoechoic mass
posterior to the lower pole of the right lobe of the thyroid gland.

Left thyroid lobe

Measurements: 4.9 x 1.7 x 1.8 cm.  No nodules visualized.

Isthmus

Thickness: 11 mm.  No nodules visualized.

Lymphadenopathy

None visualized.
IMPRESSION: No definite thyroid nodules. There is a 2.2 cm soft tissue nodule
posterior to the lower pole of the right lobe suggesting parathyroid
adenoma.

## 2020-01-23 DIAGNOSIS — R490 Dysphonia: Secondary | ICD-10-CM | POA: Insufficient documentation

## 2020-01-23 DIAGNOSIS — K219 Gastro-esophageal reflux disease without esophagitis: Secondary | ICD-10-CM | POA: Insufficient documentation

## 2021-01-24 ENCOUNTER — Other Ambulatory Visit: Payer: Self-pay

## 2021-01-24 ENCOUNTER — Ambulatory Visit
Admission: EM | Admit: 2021-01-24 | Discharge: 2021-01-24 | Disposition: A | Discharge status: Home / Self Care | Payer: BLUE CROSS/BLUE SHIELD | Attending: Emergency Medicine | Admitting: Emergency Medicine

## 2021-01-24 ENCOUNTER — Encounter: Payer: Self-pay | Admitting: Emergency Medicine

## 2021-01-24 ENCOUNTER — Emergency Department (HOSPITAL_BASED_OUTPATIENT_CLINIC_OR_DEPARTMENT_OTHER)
Admission: EM | Admit: 2021-01-24 | Discharge: 2021-01-24 | Discharge status: Against Medical Advice | Payer: BLUE CROSS/BLUE SHIELD

## 2021-01-24 DIAGNOSIS — I10 Essential (primary) hypertension: Secondary | ICD-10-CM

## 2021-01-24 HISTORY — DX: Essential (primary) hypertension: I10

## 2021-01-24 MED ORDER — AMLODIPINE BESYLATE 5 MG PO TABS: 5.0000 mg | ORAL_TABLET | Freq: Every day | ORAL | 0 refills | Status: AC

## 2021-01-24 NOTE — Discharge Instructions (Addendum)
Restart amlodipine daily, may go up to 10 mg Continue to monitor blood pressure at home Follow-up with primary care If developing worsening headaches, vision changes, facial drooping, difficulty speaking, one-sided weakness, one-sided numbness tingling, chest pain, shortness of breath please go to the emergency room

## 2021-01-24 NOTE — ED Notes (Signed)
Pt left prior to being seen according to registration.

## 2021-01-24 NOTE — ED Provider Notes (Signed)
[facility]    CSN: [id number] Arrival date & time: 01/24/21  3419      History   Chief Complaint Chief Complaint  Patient presents with   Hypertension    HPI [name] is a 45 y.o. female presenting today for evaluation of high blood pressure.  Reports Monday began to feel off from normal and was feeling slightly dizzy.  She checked her blood pressure and since is noted to be elevated.  Pressures been up to 190/123 at times.  She reports intermittent slight blurry vision and right eye, denies at present.  Denies any one-sided weakness, numbness or tingling.  Denies chest pain.  Previously was on amlodipine 5 mg daily, but came off of this as her blood pressure improved.  Has not seen PCP recently.  HPI  Past Medical History:  Diagnosis Date   Hypertension     There are no problems to display for this patient.   History reviewed. No pertinent surgical history.  OB History   No obstetric history on file.      Home Medications    Prior to Admission medications   Medication Sig Start Date End Date Taking? Authorizing Provider  amLODipine (NORVASC) 5 MG tablet Take 1 tablet (5 mg total) by mouth daily. 01/24/21  Yes Baila Rouse C, PA-C  FLUoxetine (PROZAC) 10 MG capsule Take 10 mg by mouth daily.    [provider]  Vitamin D, Ergocalciferol, (DRISDOL) 50000 UNITS CAPS Take 50,000 Units by mouth every 7 (seven) days.    [provider]    Family History History reviewed. No pertinent family history.  Social History     Allergies   Flagyl [metronidazole hcl] and Fluconazole in dextrose   Review of Systems Review of Systems  Constitutional:  Negative for fatigue and fever.  HENT:  Negative for congestion, mouth sores, sinus pressure and sore throat.   Eyes:  Positive for visual disturbance. Negative for photophobia and pain.  Respiratory:  Negative for cough and shortness of breath.   Cardiovascular:  Negative for chest  pain.  Gastrointestinal:  Negative for abdominal pain, nausea and vomiting.  Genitourinary:  Negative for decreased urine volume, genital sores and hematuria.  Musculoskeletal:  Negative for arthralgias, joint swelling, myalgias, neck pain and neck stiffness.  Skin:  Negative for color change, rash and wound.  Neurological:  Positive for dizziness and headaches. Negative for syncope, facial asymmetry, speech difficulty, weakness, light-headedness and numbness.    Physical Exam Triage Vital Signs ED Triage Vitals  Enc Vitals Group     BP      Pulse      Resp      Temp      Temp src      SpO2      Weight      Height      Head Circumference      Peak Flow      Pain Score      Pain Loc      Pain Edu?      Excl. in GC?    No data found.  Updated Vital Signs BP (!) 161/110 (BP Location: Right Arm)   Pulse 60   Temp 98.4 F (36.9 C) (Oral)   Resp 20   SpO2 96%   Visual Acuity Right Eye Distance:   Left Eye Distance:   Bilateral Distance:    Right Eye Near:   Left Eye Near:    Bilateral Near:  Physical Exam Vitals and nursing note reviewed.  Constitutional:      Appearance: She is well-developed.     Comments: No acute distress  HENT:     Head: Normocephalic and atraumatic.     Nose: Nose normal.     Mouth/Throat:     Comments: Oral mucosa pink and moist, no tonsillar enlargement or exudate. Posterior pharynx patent and nonerythematous, no uvula deviation or swelling. Normal phonation.  Eyes:     Extraocular Movements: Extraocular movements intact.     Conjunctiva/sclera: Conjunctivae normal.     Pupils: Pupils are equal, round, and reactive to light.  Cardiovascular:     Rate and Rhythm: Normal rate and regular rhythm.     Comments: No carotid bruits auscultated Pulmonary:     Effort: Pulmonary effort is normal. No respiratory distress.     Comments: Breathing comfortably at rest, CTABL, no wheezing, rales or other adventitious sounds  auscultated  Abdominal:     General: There is no distension.  Musculoskeletal:        General: Normal range of motion.     Cervical back: Neck supple.  Skin:    General: Skin is warm and dry.  Neurological:     General: No focal deficit present.     Mental Status: She is alert and oriented to person, place, and time. Mental status is at baseline.     Cranial Nerves: No cranial nerve deficit.     Motor: No weakness.     Gait: Gait normal.     UC Treatments / Results  Labs (all labs ordered are listed, but only abnormal results are displayed) Labs Reviewed - No data to display  EKG   Radiology No results found.  Procedures Procedures (including critical care time)  Medications Ordered in UC Medications - No data to display  Initial Impression / Assessment and Plan / UC Course  I have reviewed the triage vital signs and the nursing notes.  Pertinent labs & imaging results that were available during my care of the patient were reviewed by me and considered in my medical decision making (see chart for details).     Hypertension-blood pressure elevated today, does report mild symptoms earlier in the week, but currently without any neurodeficits, will reinitiate on amlodipine with continued monitoring of blood pressure and symptoms.  Encouraged follow-up with PCP as well as ophthalmologist to have eye exam.Discussed strict return precautions. Patient verbalized understanding and is agreeable with plan.  Final Clinical Impressions(s) / UC Diagnoses   Final diagnoses:  Essential hypertension     Discharge Instructions      Restart amlodipine daily, may go up to 10 mg Continue to monitor blood pressure at home Follow-up with primary care If developing worsening headaches, vision changes, facial drooping, difficulty speaking, one-sided weakness, one-sided numbness tingling, chest pain, shortness of breath please go to the emergency room     ED Prescriptions      Medication Sig Dispense Auth. Provider   amLODipine (NORVASC) 5 MG tablet Take 1 tablet (5 mg total) by mouth daily. 90 tablet Eufelia Veno, [place] C, PA-C      PDMP not reviewed this encounter.   Lew Dawes, PA-C 01/24/21 1058

## 2021-01-24 NOTE — ED Triage Notes (Signed)
Pt here for htn; pt sts hx of similar but hasnt been on meds x 4 years

## 2022-04-28 DIAGNOSIS — M778 Other enthesopathies, not elsewhere classified: Secondary | ICD-10-CM | POA: Diagnosis not present

## 2022-05-12 DIAGNOSIS — M7711 Lateral epicondylitis, right elbow: Secondary | ICD-10-CM | POA: Diagnosis not present

## 2022-07-25 DIAGNOSIS — Z202 Contact with and (suspected) exposure to infections with a predominantly sexual mode of transmission: Secondary | ICD-10-CM | POA: Diagnosis not present

## 2022-07-25 DIAGNOSIS — I10 Essential (primary) hypertension: Secondary | ICD-10-CM | POA: Diagnosis not present

## 2022-09-28 DIAGNOSIS — Z03818 Encounter for observation for suspected exposure to other biological agents ruled out: Secondary | ICD-10-CM | POA: Diagnosis not present

## 2022-09-28 DIAGNOSIS — R059 Cough, unspecified: Secondary | ICD-10-CM | POA: Diagnosis not present

## 2022-12-07 DIAGNOSIS — M25572 Pain in left ankle and joints of left foot: Secondary | ICD-10-CM | POA: Diagnosis not present

## 2022-12-07 DIAGNOSIS — M79672 Pain in left foot: Secondary | ICD-10-CM | POA: Diagnosis not present

## 2022-12-07 DIAGNOSIS — M722 Plantar fascial fibromatosis: Secondary | ICD-10-CM | POA: Diagnosis not present

## 2023-03-20 DIAGNOSIS — Z20822 Contact with and (suspected) exposure to covid-19: Secondary | ICD-10-CM | POA: Diagnosis not present

## 2023-03-20 DIAGNOSIS — R6889 Other general symptoms and signs: Secondary | ICD-10-CM | POA: Diagnosis not present

## 2023-03-20 DIAGNOSIS — R52 Pain, unspecified: Secondary | ICD-10-CM | POA: Diagnosis not present

## 2023-04-03 DIAGNOSIS — R030 Elevated blood-pressure reading, without diagnosis of hypertension: Secondary | ICD-10-CM | POA: Diagnosis not present

## 2023-06-06 DIAGNOSIS — U071 COVID-19: Secondary | ICD-10-CM | POA: Diagnosis not present

## 2023-06-06 DIAGNOSIS — R0981 Nasal congestion: Secondary | ICD-10-CM | POA: Diagnosis not present

## 2023-08-12 DIAGNOSIS — F330 Major depressive disorder, recurrent, mild: Secondary | ICD-10-CM | POA: Diagnosis not present

## 2023-08-16 HISTORY — PX: FOOT SURGERY: SHX648

## 2024-03-22 ENCOUNTER — Ambulatory Visit
Admission: EM | Admit: 2024-03-22 | Discharge: 2024-03-22 | Disposition: A | Discharge status: Home / Self Care | Attending: Family Medicine | Admitting: Family Medicine

## 2024-03-22 ENCOUNTER — Encounter: Payer: Self-pay | Admitting: Emergency Medicine

## 2024-03-22 DIAGNOSIS — I10 Essential (primary) hypertension: Secondary | ICD-10-CM | POA: Diagnosis not present

## 2024-03-22 DIAGNOSIS — D351 Benign neoplasm of parathyroid gland: Secondary | ICD-10-CM | POA: Insufficient documentation

## 2024-03-22 DIAGNOSIS — F329 Major depressive disorder, single episode, unspecified: Secondary | ICD-10-CM | POA: Insufficient documentation

## 2024-03-22 DIAGNOSIS — G43109 Migraine with aura, not intractable, without status migrainosus: Secondary | ICD-10-CM | POA: Insufficient documentation

## 2024-03-22 DIAGNOSIS — Z8739 Personal history of other diseases of the musculoskeletal system and connective tissue: Secondary | ICD-10-CM | POA: Insufficient documentation

## 2024-03-22 DIAGNOSIS — E559 Vitamin D deficiency, unspecified: Secondary | ICD-10-CM | POA: Insufficient documentation

## 2024-03-22 DIAGNOSIS — Z8669 Personal history of other diseases of the nervous system and sense organs: Secondary | ICD-10-CM | POA: Insufficient documentation

## 2024-03-22 DIAGNOSIS — Z862 Personal history of diseases of the blood and blood-forming organs and certain disorders involving the immune mechanism: Secondary | ICD-10-CM | POA: Insufficient documentation

## 2024-03-22 DIAGNOSIS — G43809 Other migraine, not intractable, without status migrainosus: Secondary | ICD-10-CM

## 2024-03-22 DIAGNOSIS — Z6831 Body mass index (BMI) 31.0-31.9, adult: Secondary | ICD-10-CM | POA: Insufficient documentation

## 2024-03-22 DIAGNOSIS — R9389 Abnormal findings on diagnostic imaging of other specified body structures: Secondary | ICD-10-CM | POA: Insufficient documentation

## 2024-03-22 MED ORDER — ONDANSETRON 4 MG PO TBDP: 4.0000 mg | ORAL_TABLET | Freq: Three times a day (TID) | ORAL | 0 refills | Status: AC | PRN

## 2024-03-22 MED ORDER — KETOROLAC TROMETHAMINE 30 MG/ML IJ SOLN
30.0000 mg | Freq: Once | INTRAMUSCULAR | Status: AC
  Administered 2024-03-22: 30 mg via INTRAMUSCULAR

## 2024-03-22 MED ORDER — ONDANSETRON 4 MG PO TBDP
4.0000 mg | ORAL_TABLET | Freq: Once | ORAL | Status: AC
  Administered 2024-03-22: 4 mg via ORAL

## 2024-03-22 NOTE — ED Provider Notes (Signed)
 [facility]  Note:  This document was prepared using Conservation officer, historic buildings and may include unintentional dictation errors.  MRN: [id number] DOB: [birth year]  Subjective:   [name] is a 48 y.o. female presenting for 1 day history of a moderate to severe right-sided migraine headache with photophobia, nausea without vomiting, dizziness/vertigo.  No confusion, weakness, double vision, numbness or tingling.  No history of stroke, TIA, neurologic disorder.  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  Patient did have a recent surgery to the left lower extremity.  Has used ibuprofen and tramadol for her pain control.  Last dosing of ibuprofen was more than 24 hours ago.  No rashes.  No history of CKD per patient.  No current facility-administered medications for this encounter.  Current Outpatient Medications:    cloNIDine (CATAPRES) 0.1 MG tablet, 1 tablet, can repeat dose 1 hour later if still >160/100 Orally twice a day; Duration: 30 days, Disp: , Rfl:    ibuprofen (ADVIL) 800 MG tablet, Take 800 mg by mouth every 8 (eight) hours as needed., Disp: , Rfl:    traMADol (ULTRAM) 50 MG tablet, Take by mouth every 6 (six) hours as needed., Disp: , Rfl:    amLODipine  (NORVASC ) 5 MG tablet, Take 1 tablet (5 mg total) by mouth daily. (Patient not taking: Reported on 03/22/2024), Disp: 90 tablet, Rfl: 0   azithromycin (ZITHROMAX) 250 MG tablet, 2 tabs for the first day, then 1 tablet for 4 days Orally as directed; Duration: 5 days (Patient not taking: Reported on 03/22/2024), Disp: , Rfl:    cyclobenzaprine (FLEXERIL) 10 MG tablet, Take 10 mg by mouth. (Patient not taking: Reported on 03/22/2024), Disp: , Rfl:    FLUoxetine (PROZAC) 10 MG capsule, Take 10 mg by mouth daily. (Patient not taking: Reported on 03/22/2024), Disp: , Rfl:    omeprazole (PRILOSEC) 40 MG capsule, Take 1 capsule twice daily, 30 minutes before morning and evening meals. (Patient not  taking: Reported on 03/22/2024), Disp: , Rfl:    Vitamin D, Ergocalciferol, (DRISDOL) 50000 UNITS CAPS, Take 50,000 Units by mouth every 7 (seven) days. (Patient not taking: Reported on 03/22/2024), Disp: , Rfl:    Allergies  Allergen Reactions   Amlodipine  Besylate     Other Reaction(s): > 5 mg, ankle swelling   Fluconazole In Dextrose    Fluoxetine Hcl     Other Reaction(s): vivid dreams   Hydrocodone Nausea Only   Hydrocodone-Acetaminophen Other (See Comments)    unknown   Lactose     Other Reaction(s): Unknown   Penicillins Other (See Comments)    Other Reaction(s): thinks may had reaction past  unknown   Shellfish Allergy Other (See Comments)    Blister around/on mouth   Shellfish-Derived Products     Other Reaction(s): blisters   Sumatriptan     Other Reaction(s): neurologic migraines/increased risk of stroke   Topiramate     Other Reaction(s): sedation   Fluconazole Dermatitis    Other Reaction(s): erythema nodosum   Metronidazole Hcl Dermatitis    Other Reaction(s): erythema nodosum    Past Medical History:  Diagnosis Date   Hypertension      Past Surgical History:  Procedure Laterality Date   FOOT SURGERY Left 2025    History reviewed. No pertinent family history.  Social History   Tobacco Use   Smoking status: Never    Passive exposure: Never   Smokeless tobacco: Never  Vaping Use   Vaping  status: Never Used  Substance Use Topics   Alcohol use: Not Currently   Drug use: Not Currently    ROS   Objective:   Vitals: BP (!) 144/88 (BP Location: Left Arm)   Pulse 72   Temp 98.3 F (36.8 C) (Oral)   Resp 16   SpO2 96%   Physical Exam Constitutional:      General: She is not in acute distress.    Appearance: Normal appearance. She is well-developed and normal weight. She is not ill-appearing, toxic-appearing or diaphoretic.  HENT:     Head: Normocephalic and atraumatic.     Right Ear: Tympanic membrane, ear canal and external ear normal. No  drainage or tenderness. No middle ear effusion. There is no impacted cerumen. Tympanic membrane is not erythematous or bulging.     Left Ear: Tympanic membrane, ear canal and external ear normal. No drainage or tenderness.  No middle ear effusion. There is no impacted cerumen. Tympanic membrane is not erythematous or bulging.     Nose: Nose normal. No congestion or rhinorrhea.     Mouth/Throat:     Mouth: Mucous membranes are moist. No oral lesions.     Pharynx: No pharyngeal swelling, oropharyngeal exudate, posterior oropharyngeal erythema or uvula swelling.     Tonsils: No tonsillar exudate or tonsillar abscesses.  Eyes:     General: No scleral icterus.       Right eye: No discharge.        Left eye: No discharge.     Extraocular Movements: Extraocular movements intact.     Right eye: Normal extraocular motion.     Left eye: Normal extraocular motion.     Conjunctiva/sclera: Conjunctivae normal.  Neck:     Meningeal: Brudzinski's sign and Kernig's sign absent.  Cardiovascular:     Rate and Rhythm: Normal rate.  Pulmonary:     Effort: Pulmonary effort is normal.  Musculoskeletal:     Cervical back: Normal range of motion and neck supple.  Lymphadenopathy:     Cervical: No cervical adenopathy.  Skin:    General: Skin is warm and dry.  Neurological:     General: No focal deficit present.     Mental Status: She is alert and oriented to person, place, and time.     Cranial Nerves: No cranial nerve deficit, dysarthria or facial asymmetry.     Motor: No weakness or pronator drift.     Coordination: Romberg sign negative. Coordination normal. Finger-Nose-Finger Test and Heel to [facility] Test normal. Rapid alternating movements normal.     Gait: Gait and tandem walk normal.     Deep Tendon Reflexes: Reflexes normal.  Psychiatric:        Mood and Affect: Mood normal.        Behavior: Behavior normal.        Thought Content: Thought content normal.        Judgment: Judgment normal.    IM  Toradol  30 mg administered in clinic.  P.o. Zofran  ODT 4 mg administered in clinic.  Assessment and Plan :   PDMP not reviewed this encounter.  1. Other migraine without status migrainosus, not intractable   2. Essential hypertension    Reassuring neurologic exam and therefore will defer ER visit..  Monitor for her blood pressure.  Patient has standing orders to use clonidine if it elevates past 160 systolic.  Migraine interventions as above.  Follow-up with PCP as soon as possible.  Counseled patient on potential for adverse effects  with medications prescribed/recommended today, ER and return-to-clinic precautions discussed, patient verbalized understanding.    Christopher Savannah, [place] 03/22/24 1610

## 2024-03-22 NOTE — ED Triage Notes (Signed)
 Pt reports migraine and intermittent dizziness that started this morning. Notes she had foot surgery last week and is on a tramadol dose now - unsure if this could be related. States she has not taken any tramadol today prior to symptoms starting. Dizziness occurs when she is up and moving - feels like the room is spinning. Denies photosensitivity. Pt reports a emesis episode a few hrs ago associated with migraine, but denies current nausea. No med use for symptoms. Pt notes she does have a hx of migraines, but has not had one in months.
